# Patient Record
Sex: Male | Born: 1965 | Race: White | Hispanic: No | Marital: Single | Smoking: Current every day smoker
Health system: Southern US, Community
[De-identification: ages and names within clinical notes are randomized; demographics above are authoritative.]

## PROBLEM LIST (undated history)

## (undated) DIAGNOSIS — I10 Essential (primary) hypertension: Secondary | ICD-10-CM

## (undated) HISTORY — PX: PLEURAL SCARIFICATION: SHX748

## (undated) HISTORY — PX: HAND SURGERY: SHX662

## (undated) HISTORY — PX: LEG SURGERY: SHX1003

---

## 2008-02-13 ENCOUNTER — Ambulatory Visit: Payer: Self-pay | Admitting: Cardiology

## 2011-01-01 ENCOUNTER — Emergency Department (HOSPITAL_COMMUNITY)
Admission: EM | Admit: 2011-01-01 | Discharge: 2011-01-01 | Disposition: A | Discharge status: Home / Self Care | Payer: Self-pay | Attending: Emergency Medicine | Admitting: Emergency Medicine

## 2011-01-01 ENCOUNTER — Emergency Department (HOSPITAL_COMMUNITY): Payer: Self-pay

## 2011-01-01 DIAGNOSIS — M7020 Olecranon bursitis, unspecified elbow: Secondary | ICD-10-CM | POA: Insufficient documentation

## 2011-01-01 DIAGNOSIS — M79609 Pain in unspecified limb: Secondary | ICD-10-CM | POA: Insufficient documentation

## 2011-01-01 DIAGNOSIS — M7989 Other specified soft tissue disorders: Secondary | ICD-10-CM | POA: Insufficient documentation

## 2011-01-03 LAB — WOUND CULTURE: Culture: NO GROWTH

## 2011-09-23 ENCOUNTER — Encounter: Payer: Self-pay | Admitting: Emergency Medicine

## 2011-09-23 ENCOUNTER — Emergency Department (HOSPITAL_COMMUNITY)
Admission: EM | Admit: 2011-09-23 | Discharge: 2011-09-23 | Disposition: A | Discharge status: Home / Self Care | Payer: Self-pay | Attending: Emergency Medicine | Admitting: Emergency Medicine

## 2011-09-23 ENCOUNTER — Emergency Department (HOSPITAL_COMMUNITY): Payer: Self-pay

## 2011-09-23 DIAGNOSIS — S61239A Puncture wound without foreign body of unspecified finger without damage to nail, initial encounter: Secondary | ICD-10-CM

## 2011-09-23 DIAGNOSIS — F17200 Nicotine dependence, unspecified, uncomplicated: Secondary | ICD-10-CM | POA: Insufficient documentation

## 2011-09-23 DIAGNOSIS — S61209A Unspecified open wound of unspecified finger without damage to nail, initial encounter: Secondary | ICD-10-CM | POA: Insufficient documentation

## 2011-09-23 DIAGNOSIS — Y92009 Unspecified place in unspecified non-institutional (private) residence as the place of occurrence of the external cause: Secondary | ICD-10-CM | POA: Insufficient documentation

## 2011-09-23 DIAGNOSIS — W268XXA Contact with other sharp object(s), not elsewhere classified, initial encounter: Secondary | ICD-10-CM | POA: Insufficient documentation

## 2011-09-23 LAB — CBC
HCT: 44.2 % (ref 39.0–52.0)
MCHC: 34.2 g/dL (ref 30.0–36.0)
Platelets: 273 10*3/uL (ref 150–400)
RDW: 12.4 % (ref 11.5–15.5)
WBC: 6.3 10*3/uL (ref 4.0–10.5)

## 2011-09-23 LAB — DIFFERENTIAL
Basophils Absolute: 0 10*3/uL (ref 0.0–0.1)
Basophils Relative: 1 % (ref 0–1)
Lymphocytes Relative: 33 % (ref 12–46)
Neutro Abs: 3.5 10*3/uL (ref 1.7–7.7)
Neutrophils Relative %: 55 % (ref 43–77)

## 2011-09-23 LAB — BASIC METABOLIC PANEL
CO2: 26 mEq/L (ref 19–32)
Chloride: 105 mEq/L (ref 96–112)
GFR calc Af Amer: >90 mL/min (ref >90)
Potassium: 4.3 mEq/L (ref 3.5–5.1)
Sodium: 139 mEq/L (ref 135–145)

## 2011-09-23 MED ORDER — IBUPROFEN 800 MG PO TABS
800.0000 mg | ORAL_TABLET | Freq: Once | ORAL | Status: AC
  Administered 2011-09-23: 800 mg via ORAL
  Filled 2011-09-23: qty 1

## 2011-09-23 MED ORDER — SODIUM CHLORIDE 0.9 % IV SOLN
Freq: Once | INTRAVENOUS | Status: AC
  Administered 2011-09-23: 10:00:00 via INTRAVENOUS

## 2011-09-23 MED ORDER — OXYCODONE-ACETAMINOPHEN 5-325 MG PO TABS
1.0000 | ORAL_TABLET | Freq: Once | ORAL | Status: AC
  Administered 2011-09-23: 1 via ORAL
  Filled 2011-09-23: qty 1

## 2011-09-23 MED ORDER — TETANUS-DIPHTH-ACELL PERTUSSIS 5-2.5-18.5 LF-MCG/0.5 IM SUSP
0.5000 mL | Freq: Once | INTRAMUSCULAR | Status: AC
  Administered 2011-09-23: 0.5 mL via INTRAMUSCULAR
  Filled 2011-09-23: qty 0.5

## 2011-09-23 MED ORDER — OXYCODONE-ACETAMINOPHEN 5-325 MG PO TABS: ORAL_TABLET | ORAL | Status: DC

## 2011-09-23 MED ORDER — CEPHALEXIN 500 MG PO CAPS: 500.0000 mg | ORAL_CAPSULE | Freq: Four times a day (QID) | ORAL | Status: AC

## 2011-09-23 MED ORDER — CEFAZOLIN SODIUM 1-5 GM-% IV SOLN
1.0000 g | Freq: Once | INTRAVENOUS | Status: AC
  Administered 2011-09-23: 1 g via INTRAVENOUS
  Filled 2011-09-23: qty 50

## 2011-09-23 NOTE — ED Notes (Signed)
Pt alert and oriented x 4 and respirations even and unlabored.  Discharge instructions and prescriptions x 2 reviewed with pt and pt verbalized understanding.  Pt ambulated with steady gait to POV.  No acute distress at this time.

## 2011-09-23 NOTE — ED Notes (Signed)
Pt states he was building a shelf yesterday morning using a nail gun and a nail went through his right middle finger and came out the other side.  Pt states he is unable to move his right hand and fingers.

## 2011-09-23 NOTE — ED Provider Notes (Signed)
History     CSN: [id number] Arrival date & time: 09/23/2011  8:54 AM   First MD Initiated Contact with Patient 09/23/11 [phone]      Chief Complaint  Patient presents with  . Hand Injury    (Consider location/radiation/quality/duration/timing/severity/associated sxs/prior treatment) Patient is a 45 y.o. male presenting with hand injury. The history is provided by the patient. No language interpreter was used.  Hand Injury  The incident occurred yesterday. The incident occurred at home. Injury mechanism: brother-in-law was helping him build bookshelves and fired a  16d nail thru R 3rd finger. The pain is present in the right fingers. The quality of the pain is described as throbbing. The pain is at a severity of 4/10. The pain has been constant since the incident. Pertinent negatives include no fever. It is unknown if a foreign body is present. The symptoms are aggravated by movement and palpation. He has tried nothing for the symptoms.    History reviewed. No pertinent past medical history.  Past Surgical History  Procedure Date  . Hand surgery   . Leg surgery     No family history on file.  History  Substance Use Topics  . Smoking status: Current Everyday Smoker -- 1.5 packs/day  . Smokeless tobacco: Not on file  . Alcohol Use: Yes      Review of Systems  Constitutional: Negative for fever and chills.  Skin: Positive for wound.  All other systems reviewed and are negative.    Allergies  Review of patient's allergies indicates no known allergies.  Home Medications  No current outpatient prescriptions on file.  BP 148/99  Pulse 86  Temp(Src) 97.9 F (36.6 C) (Oral)  Resp 20  SpO2 100%  Physical Exam  Nursing note and vitals reviewed. Constitutional: He is oriented to person, place, and time. He appears well-developed and well-nourished.  HENT:  Head: Normocephalic and atraumatic.  Eyes: EOM are normal.  Neck: Normal range of motion.  Cardiovascular:  Normal rate, regular rhythm, normal heart sounds and intact distal pulses.   Pulmonary/Chest: Effort normal and breath sounds normal. No respiratory distress.  Abdominal: Soft. He exhibits no distension. There is no tenderness.  Musculoskeletal: He exhibits tenderness.       Right hand: He exhibits decreased range of motion, tenderness, bony tenderness, laceration and swelling. He exhibits normal capillary refill and no deformity. normal sensation noted.       Hands: Neurological: He is alert and oriented to person, place, and time.  Skin: Skin is warm and dry.  Psychiatric: He has a normal mood and affect. Judgment normal.    ED Course  Procedures (including critical care time)   Labs Reviewed  CBC  DIFFERENTIAL  BASIC METABOLIC PANEL   No results found.   No diagnosis found.    MDM  Talked to pt and family about x-rays.  No bony injury.  Has rec'd 1 gram of ancef IV.        Worthy Rancher, PA 09/23/11 [phone]

## 2011-09-24 NOTE — ED Provider Notes (Signed)
History     CSN: [id number] Arrival date & time: 09/23/2011  8:54 AM   First MD Initiated Contact with Patient 09/23/11 [phone]      Chief Complaint  Patient presents with  . Hand Injury    (Consider location/radiation/quality/duration/timing/severity/associated sxs/prior treatment) HPI  History reviewed. No pertinent past medical history.  Past Surgical History  Procedure Date  . Hand surgery   . Leg surgery     No family history on file.  History  Substance Use Topics  . Smoking status: Current Everyday Smoker -- 1.5 packs/day  . Smokeless tobacco: Not on file  . Alcohol Use: Yes      Review of Systems  Allergies  Review of patient's allergies indicates no known allergies.  Home Medications   Current Outpatient Rx  Name Route Sig Dispense Refill  . ASPIRIN 325 MG PO TABS Oral Take 650 mg by mouth every 6 (six) hours as needed. pain     . CEPHALEXIN 500 MG PO CAPS Oral Take 1 capsule (500 mg total) by mouth 4 (four) times daily. 28 capsule 0  . OXYCODONE-ACETAMINOPHEN 5-325 MG PO TABS  One tab po q  4-6 hrs prn pain 20 tablet 0    BP 140/86  Pulse 70  Temp(Src) 97.9 F (36.6 C) (Oral)  Resp 20  SpO2 98%  Physical Exam  ED Course  Procedures (including critical care time)   Labs Reviewed  CBC  DIFFERENTIAL  BASIC METABOLIC PANEL   Dg Finger Middle Right  09/23/2011  *RADIOLOGY REPORT*  Clinical Data: Pneumonic nail gun injury yesterday  RIGHT MIDDLE FINGER 2+V  Comparison: None  Findings: There is no evidence of fracture or dislocation.  There is no evidence of arthropathy or other focal bone abnormality.  Mild soft tissue swelling noted.  IMPRESSION:  No acute osseous injury noted.  Mild soft tissue swelling.  Original Report Authenticated By: Rosealee Albee, M.D.     1. Puncture wound of finger, right       MDM  Medical screening examination/treatment/procedure(s) were conducted as a shared visit with non-physician practitioner(s) and  myself.  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 On exam the patient has pain, though no overt signs of infection. X-ray does not indicate penetration of the appropriate joint, nor fracture. The patient was discharged with antibiotics, orthopedics followup       Gerhard Munch, MD 09/24/11 [phone]

## 2011-09-24 NOTE — ED Provider Notes (Signed)
Medical screening examination/treatment/procedure(s) were conducted as a shared visit with non-physician practitioner(s) and myself.  I personally evaluated the patient during the encounter  Gerhard Munch, MD Physician Signed  09/24/2011 7:44 AM  Delete Bookmark   History      CSN: [id number] Arrival date & time: 09/23/2011  8:54 AM    First MD Initiated Contact with Patient 09/23/11 0857         Chief Complaint   Patient presents with   .  Hand Injury      (Consider location/radiation/quality/duration/timing/severity/associated sxs/prior treatment) HPI   History reviewed. No pertinent past medical history.    Past Surgical History   Procedure  Date   .  Hand surgery     .  Leg surgery        No family history on file.    History   Substance Use Topics   .  Smoking status:  Current Everyday Smoker -- 1.5 packs/day   .  Smokeless tobacco:  Not on file   .  Alcohol Use:  Yes        Review of Systems    Allergies    Review of patient's allergies indicates no known allergies.    Home Medications       Current Outpatient Rx   Name  Route  Sig  Dispense  Refill   .  ASPIRIN 325 MG PO TABS  Oral  Take 650 mg by mouth every 6 (six) hours as needed. pain        .  CEPHALEXIN 500 MG PO CAPS  Oral  Take 1 capsule (500 mg total) by mouth 4 (four) times daily.  28 capsule  0   .  OXYCODONE-ACETAMINOPHEN 5-325 MG PO TABS    One tab po q  4-6 hrs prn pain  20 tablet  0      BP 140/86  Pulse 70  Temp(Src) 97.9 F (36.6 C) (Oral)  Resp 20  SpO2 98%   Physical Exam    ED Course    Procedures (including critical care time)   Labs Reviewed   CBC   DIFFERENTIAL   BASIC METABOLIC PANEL    Dg Finger Middle Right   09/23/2011  *RADIOLOGY REPORT*  Clinical Data: Pneumonic nail gun injury yesterday  RIGHT MIDDLE FINGER 2+V  Comparison: None  Findings: There is no evidence of fracture or dislocation.  There is no evidence of arthropathy or other focal bone  abnormality.  Mild soft tissue swelling noted.  IMPRESSION:  No acute osseous injury noted.  Mild soft tissue swelling.  Original Report Authenticated By: Rosealee Albee, M.D.        1.  Puncture wound of finger, right            MDM    Medical screening examination/treatment/procedure(s) were conducted as a shared visit with non-physician practitioner(s) and myself.  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 On exam the patient has pain, though no overt signs of infection. X-ray does not indicate penetration of the appropriate joint, nor fracture. The patient was discharged with antibiotics, orthopedics followup     (there is a separate, partial chart with the above statement as the sole entry.  Please disregard that chart as it was a system error, and cannot be deleted.)  Gerhard Munch, MD 09/24/11 [phone]

## 2011-12-29 ENCOUNTER — Encounter (HOSPITAL_COMMUNITY): Payer: Self-pay | Admitting: *Deleted

## 2011-12-29 ENCOUNTER — Emergency Department (HOSPITAL_COMMUNITY)
Admission: EM | Admit: 2011-12-29 | Discharge: 2011-12-30 | Disposition: A | Discharge status: Home / Self Care | Payer: Self-pay | Attending: Emergency Medicine | Admitting: Emergency Medicine

## 2011-12-29 DIAGNOSIS — K029 Dental caries, unspecified: Secondary | ICD-10-CM | POA: Insufficient documentation

## 2011-12-29 DIAGNOSIS — F17200 Nicotine dependence, unspecified, uncomplicated: Secondary | ICD-10-CM | POA: Insufficient documentation

## 2011-12-29 DIAGNOSIS — Z7982 Long term (current) use of aspirin: Secondary | ICD-10-CM | POA: Insufficient documentation

## 2011-12-29 DIAGNOSIS — K089 Disorder of teeth and supporting structures, unspecified: Secondary | ICD-10-CM | POA: Insufficient documentation

## 2011-12-29 MED ORDER — PENICILLIN V POTASSIUM 500 MG PO TABS: 500.0000 mg | ORAL_TABLET | Freq: Four times a day (QID) | ORAL | Status: AC

## 2011-12-29 MED ORDER — OXYCODONE-ACETAMINOPHEN 5-325 MG PO TABS
ORAL_TABLET | ORAL | Status: AC
  Filled 2011-12-29: qty 2

## 2011-12-29 MED ORDER — OXYCODONE-ACETAMINOPHEN 5-325 MG PO TABS
2.0000 | ORAL_TABLET | Freq: Once | ORAL | Status: AC
  Administered 2011-12-29: 2 via ORAL

## 2011-12-29 NOTE — ED Notes (Signed)
Pt states has a tooth on the bottom right side of jaw rotten. Pt has consulted dentist about tooth.

## 2011-12-29 NOTE — ED Provider Notes (Signed)
History     CSN: [id number]  Arrival date & time 12/29/11  2329   First MD Initiated Contact with Patient 12/29/11 2342      Chief Complaint  Patient presents with  . Dental Pain     Patient is a 46 y.o. male presenting with tooth pain. The history is provided by the patient.  Dental PainThe primary symptoms include mouth pain. Primary symptoms do not include fever. The symptoms began more than 1 month ago. The symptoms are worsening. The symptoms are chronic. The symptoms occur constantly.  Associated symptoms comments: Denies fever/vomiting .     History reviewed. No pertinent past medical history.  Past Surgical History  Procedure Date  . Hand surgery   . Leg surgery     No family history on file.  History  Substance Use Topics  . Smoking status: Current Everyday Smoker -- 1.5 packs/day  . Smokeless tobacco: Not on file  . Alcohol Use: Yes      Review of Systems  Constitutional: Negative for fever.  Gastrointestinal: Negative for vomiting.    Allergies  Review of patient's allergies indicates no known allergies.  Home Medications   Current Outpatient Rx  Name Route Sig Dispense Refill  . ASPIRIN 325 MG PO TABS Oral Take 650 mg by mouth every 6 (six) hours as needed. pain     . IBUPROFEN 200 MG PO TABS Oral Take 400 mg by mouth every 6 (six) hours as needed.    . OXYCODONE-ACETAMINOPHEN 5-325 MG PO TABS  One tab po q  4-6 hrs prn pain 20 tablet 0  . PENICILLIN V POTASSIUM 500 MG PO TABS Oral Take 1 tablet (500 mg total) by mouth 4 (four) times daily. 40 tablet 0    BP 139/85  Pulse 92  Temp(Src) 97.9 F (36.6 C) (Oral)  Resp 20  Ht 5\' 8"  (1.727 m)  Wt 145 lb (65.772 kg)  BMI 22.05 kg/m2  SpO2 98%  Physical Exam CONSTITUTIONAL: Well developed/well nourished HEAD AND FACE: Normocephalic/atraumatic EYES: EOMI/PERRL ENMT: Mucous membranes moist, no trismus.  Widespread decay, tender to palpation of right premolar but no focal abscess noted NECK:  supple no meningeal signs CV: S1/S2 noted, no murmurs/rubs/gallops noted LUNGS: Lungs are clear to auscultation bilaterally, no apparent distress ABDOMEN: soft, nontender, no rebound or guarding NEURO: Pt is awake/alert, moves all extremitiesx4 EXTREMITIES: pulses normal, full ROM SKIN: warm, color normal PSYCH: no abnormalities of mood noted  ED Course  Procedures     1. Dental caries       MDM  Nursing notes reviewed and considered in documentation         Joya Gaskins, MD 12/29/11 2354

## 2011-12-30 NOTE — ED Notes (Signed)
Pt alert & oriented x4, stable gait. Pt given discharge instructions, paperwork & prescription(s), pt verbalized understanding. Pt left department w/ no further questions.  

## 2013-04-17 ENCOUNTER — Other Ambulatory Visit (HOSPITAL_COMMUNITY): Payer: Self-pay | Admitting: *Deleted

## 2013-04-17 ENCOUNTER — Ambulatory Visit (HOSPITAL_COMMUNITY)
Admission: RE | Admit: 2013-04-17 | Discharge: 2013-04-17 | Disposition: A | Discharge status: Home / Self Care | Payer: Self-pay | Source: Ambulatory Visit | Attending: *Deleted | Admitting: *Deleted

## 2013-04-17 DIAGNOSIS — M2550 Pain in unspecified joint: Secondary | ICD-10-CM

## 2013-04-17 DIAGNOSIS — M25569 Pain in unspecified knee: Secondary | ICD-10-CM | POA: Insufficient documentation

## 2013-08-12 ENCOUNTER — Emergency Department (HOSPITAL_COMMUNITY): Payer: Self-pay

## 2013-08-12 ENCOUNTER — Emergency Department (HOSPITAL_COMMUNITY)
Admission: EM | Admit: 2013-08-12 | Discharge: 2013-08-12 | Disposition: A | Discharge status: Home / Self Care | Payer: Self-pay | Attending: Emergency Medicine | Admitting: Emergency Medicine

## 2013-08-12 ENCOUNTER — Encounter (HOSPITAL_COMMUNITY): Payer: Self-pay | Admitting: *Deleted

## 2013-08-12 DIAGNOSIS — R112 Nausea with vomiting, unspecified: Secondary | ICD-10-CM | POA: Insufficient documentation

## 2013-08-12 DIAGNOSIS — J40 Bronchitis, not specified as acute or chronic: Secondary | ICD-10-CM | POA: Insufficient documentation

## 2013-08-12 DIAGNOSIS — R197 Diarrhea, unspecified: Secondary | ICD-10-CM | POA: Insufficient documentation

## 2013-08-12 DIAGNOSIS — R509 Fever, unspecified: Secondary | ICD-10-CM | POA: Insufficient documentation

## 2013-08-12 DIAGNOSIS — H9209 Otalgia, unspecified ear: Secondary | ICD-10-CM | POA: Insufficient documentation

## 2013-08-12 DIAGNOSIS — F17200 Nicotine dependence, unspecified, uncomplicated: Secondary | ICD-10-CM | POA: Insufficient documentation

## 2013-08-12 DIAGNOSIS — R1013 Epigastric pain: Secondary | ICD-10-CM | POA: Insufficient documentation

## 2013-08-12 LAB — COMPREHENSIVE METABOLIC PANEL
ALT: 17 U/L (ref 0–53)
AST: 24 U/L (ref 0–37)
CO2: 27 mEq/L (ref 19–32)
Calcium: 9.9 mg/dL (ref 8.4–10.5)
Chloride: 100 mEq/L (ref 96–112)
GFR calc Af Amer: >90 mL/min (ref >90)
GFR calc non Af Amer: >90 mL/min (ref >90)
Glucose, Bld: 96 mg/dL (ref 70–99)
Sodium: 139 mEq/L (ref 135–145)
Total Bilirubin: 0.3 mg/dL (ref 0.3–1.2)

## 2013-08-12 LAB — CBC WITH DIFFERENTIAL/PLATELET
Basophils Absolute: 0.1 10*3/uL (ref 0.0–0.1)
Eosinophils Relative: 1 % (ref 0–5)
HCT: 49.7 % (ref 39.0–52.0)
Lymphocytes Relative: 25 % (ref 12–46)
Lymphs Abs: 1.9 10*3/uL (ref 0.7–4.0)
MCV: 98.8 fL (ref 78.0–100.0)
Monocytes Absolute: 0.8 10*3/uL (ref 0.1–1.0)
Neutro Abs: 4.7 10*3/uL (ref 1.7–7.7)
Platelets: 264 10*3/uL (ref 150–400)
RBC: 5.03 MIL/uL (ref 4.22–5.81)
RDW: 12.3 % (ref 11.5–15.5)
WBC: 7.7 10*3/uL (ref 4.0–10.5)

## 2013-08-12 MED ORDER — ONDANSETRON 8 MG PO TBDP: 8.0000 mg | ORAL_TABLET | Freq: Once | ORAL | Status: DC

## 2013-08-12 MED ORDER — ALBUTEROL SULFATE (5 MG/ML) 0.5% IN NEBU
5.0000 mg | INHALATION_SOLUTION | Freq: Once | RESPIRATORY_TRACT | Status: AC
  Administered 2013-08-12: 5 mg via RESPIRATORY_TRACT
  Filled 2013-08-12: qty 1

## 2013-08-12 MED ORDER — HYDROCODONE-ACETAMINOPHEN 5-325 MG PO TABS
1.0000 | ORAL_TABLET | Freq: Once | ORAL | Status: AC
  Administered 2013-08-12: 1 via ORAL
  Filled 2013-08-12: qty 1

## 2013-08-12 MED ORDER — GUAIFENESIN-CODEINE 100-10 MG/5ML PO SYRP: 5.0000 mL | ORAL_SOLUTION | Freq: Three times a day (TID) | ORAL | Status: AC | PRN

## 2013-08-12 MED ORDER — SODIUM CHLORIDE 0.9 % IV SOLN
INTRAVENOUS | Status: DC
  Administered 2013-08-12: 12:00:00 via INTRAVENOUS

## 2013-08-12 MED ORDER — ALBUTEROL SULFATE HFA 108 (90 BASE) MCG/ACT IN AERS
2.0000 | INHALATION_SPRAY | RESPIRATORY_TRACT | Status: DC | PRN
  Administered 2013-08-12: 2 via RESPIRATORY_TRACT
  Filled 2013-08-12: qty 6.7

## 2013-08-12 MED ORDER — PROMETHAZINE HCL 25 MG PO TABS: 25.0000 mg | ORAL_TABLET | Freq: Four times a day (QID) | ORAL | Status: AC | PRN

## 2013-08-12 MED ORDER — IPRATROPIUM BROMIDE 0.02 % IN SOLN
0.5000 mg | Freq: Once | RESPIRATORY_TRACT | Status: AC
  Administered 2013-08-12: 0.5 mg via RESPIRATORY_TRACT
  Filled 2013-08-12: qty 2.5

## 2013-08-12 MED ORDER — AZITHROMYCIN 250 MG PO TABS: 250.0000 mg | ORAL_TABLET | Freq: Every day | ORAL | Status: AC

## 2013-08-12 MED ORDER — ONDANSETRON HCL 4 MG/2ML IJ SOLN
4.0000 mg | Freq: Once | INTRAMUSCULAR | Status: AC
  Administered 2013-08-12: 4 mg via INTRAVENOUS
  Filled 2013-08-12: qty 2

## 2013-08-12 NOTE — Progress Notes (Signed)
ED/CM noted patient did not have health insurance and/or PCP listed in the computer.  Patient was given the [place] resource handout with information on the clinics, food pantries, and the handout for new health insurance sign-up.  Patient expressed appreciation for this. 

## 2013-08-12 NOTE — ED Notes (Signed)
Cough , congestion for 4 days, alert,

## 2013-08-12 NOTE — ED Notes (Signed)
Pt also reports NVD x2 days.

## 2013-08-12 NOTE — ED Provider Notes (Signed)
CSN: [id number]     Arrival date & time 08/12/13  1034 History   First MD Initiated Contact with Patient 08/12/13 1109     Chief Complaint  Patient presents with  . Cough   (Consider location/radiation/quality/duration/timing/severity/associated sxs/prior Treatment) Patient is a 47 y.o. male presenting with cough. The history is provided by the patient.  Cough Cough characteristics:  Productive Sputum characteristics:  Yellow Severity:  Moderate Onset quality:  Gradual Duration:  4 days Timing:  Sporadic Progression:  Worsening Chronicity:  New Smoker: yes   Relieved by:  Nothing Worsened by:  Smoking and lying down Ineffective treatments:  Decongestant Associated symptoms: chills, ear pain, fever, myalgias, sinus congestion and sore throat   Associated symptoms: no eye discharge and no rash    Devin Abbott is a 47 y.o. male who presents to the ED with cough, cold and congestion that started 4 days ago. He reports fever and chills, nausea, vomiting and diarrhea. Last vomited yesterday and has not eaten since then due to nausea. Diarrhea x 3 since yesterday. Patient smokes one pack per day.  History reviewed. No pertinent past medical history. Past Surgical History  Procedure Laterality Date  . Hand surgery    . Leg surgery    . Pleural scarification     History reviewed. No pertinent family history. History  Substance Use Topics  . Smoking status: Current Every Day Smoker -- 1.50 packs/day  . Smokeless tobacco: Not on file  . Alcohol Use: Yes    Review of Systems  Constitutional: Positive for fever and chills.  HENT: Positive for ear pain and sore throat.   Eyes: Negative for discharge and visual disturbance.  Respiratory: Positive for cough.   Gastrointestinal: Positive for nausea, vomiting and diarrhea.  Genitourinary: Negative for dysuria, urgency and frequency.  Musculoskeletal: Positive for myalgias.  Skin: Negative for rash.  Neurological: Negative for  dizziness and syncope.  Psychiatric/Behavioral: The patient is not nervous/anxious.     Allergies  Review of patient's allergies indicates no known allergies.  Home Medications   Current Outpatient Rx  Name  Route  Sig  Dispense  Refill  . GuaiFENesin (MUCINEX PO)   Oral   Take 1 tablet by mouth every 4 (four) hours as needed (cough).         Marland Kitchen ibuprofen (ADVIL,MOTRIN) 200 MG tablet   Oral   Take 800 mg by mouth every 6 (six) hours as needed.           BP 125/93  Pulse 89  Temp(Src) 98.3 F (36.8 C) (Oral)  Resp 20  Ht 5\' 8"  (1.727 m)  Wt 150 lb (68.04 kg)  BMI 22.81 kg/m2  SpO2 100% Physical Exam  Nursing note and vitals reviewed. Constitutional: He is oriented to person, place, and time. He appears well-developed and well-nourished. No distress.  HENT:  Head: Normocephalic.  Eyes: Conjunctivae and EOM are normal.  Neck: Neck supple.  Cardiovascular: Normal rate and regular rhythm.   Pulmonary/Chest: Effort normal. He has decreased breath sounds. He has wheezes. He has rhonchi.  Abdominal: Soft. Bowel sounds are normal. There is tenderness in the epigastric area.  Musculoskeletal: Normal range of motion.  Neurological: He is alert and oriented to person, place, and time. No cranial nerve deficit.  Skin: Skin is warm and dry.  Psychiatric: He has a normal mood and affect. His behavior is normal.   Results for orders placed during the hospital encounter of 08/12/13 (from the past 24 hour(s))  CBC WITH DIFFERENTIAL     Status: Abnormal   Collection Time    08/12/13 12:15 PM      Result Value Range   WBC 7.7  4.0 - 10.5 K/uL   RBC 5.03  4.22 - 5.81 MIL/uL   Hemoglobin 17.5 (*) 13.0 - 17.0 g/dL   HCT 16.1  09.6 - 04.5 %   MCV 98.8  78.0 - 100.0 fL   MCH 34.8 (*) 26.0 - 34.0 pg   MCHC 35.2  30.0 - 36.0 g/dL   RDW 40.9  81.1 - 91.4 %   Platelets 264  150 - 400 K/uL   Neutrophils Relative % 61  43 - 77 %   Neutro Abs 4.7  1.7 - 7.7 K/uL   Lymphocytes Relative  25  12 - 46 %   Lymphs Abs 1.9  0.7 - 4.0 K/uL   Monocytes Relative 11  3 - 12 %   Monocytes Absolute 0.8  0.1 - 1.0 K/uL   Eosinophils Relative 1  0 - 5 %   Eosinophils Absolute 0.1  0.0 - 0.7 K/uL   Basophils Relative 1  0 - 1 %   Basophils Absolute 0.1  0.0 - 0.1 K/uL  COMPREHENSIVE METABOLIC PANEL     Status: Abnormal   Collection Time    08/12/13 12:15 PM      Result Value Range   Sodium 139  135 - 145 mEq/L   Potassium 4.0  3.5 - 5.1 mEq/L   Chloride 100  96 - 112 mEq/L   CO2 27  19 - 32 mEq/L   Glucose, Bld 96  70 - 99 mg/dL   BUN 5 (*) 6 - 23 mg/dL   Creatinine, Ser 7.82  0.50 - 1.35 mg/dL   Calcium 9.9  8.4 - 95.6 mg/dL   Total Protein 8.3  6.0 - 8.3 g/dL   Albumin 4.0  3.5 - 5.2 g/dL   AST 24  0 - 37 U/L   ALT 17  0 - 53 U/L   Alkaline Phosphatase 86  39 - 117 U/L   Total Bilirubin 0.3  0.3 - 1.2 mg/dL   GFR calc non Af Amer >90  >90 mL/min   GFR calc Af Amer >90  >90 mL/min  LIPASE, BLOOD     Status: None   Collection Time    08/12/13 12:15 PM      Result Value Range   Lipase 27  11 - 59 U/L    Dg Chest 2 View  08/12/2013   CLINICAL DATA:  Cough and fever  EXAM: CHEST  2 VIEW  COMPARISON:  12/16/2012  FINDINGS: The cardiac shadow is within normal limits. The lungs are well aerated bilaterally. No focal infiltrate is seen. Very mild interstitial changes are noted.  IMPRESSION: No active cardiopulmonary disease.   Electronically Signed   By: Alcide Clever   On: 08/12/2013 13:00    ED Course: 14:28 after IV hydration, Zofran and albuterol/atrovent neb. Patient feeling better. Re examined and decreased wheezing, less coughing, no nausea. Will repeat Albuterol neb.  Procedures  14:50 Re examined, improved breath sounds, continues to have some wheezing. Albuterol Inhaler to go home with patient, instructions to patient given by Respiratory therapist.    MDM  47 y.o. male with cough cold and congestion x 4 days, nausea and vomiting. Will treat for bronchitis and  gastroenteritis. Patient stable for discharge without any immediate complications. I have reviewed this patient's vital signs, nurses notes,  appropriate labs and imaging.  I have discussed findings with the patient and plan of care. He voices understanding. He will return if symptoms worsen.   Medication List    TAKE these medications       azithromycin 250 MG tablet  Commonly known as:  ZITHROMAX  Take 1 tablet (250 mg total) by mouth daily. Take first 2 tablets together, then 1 every day until finished.     guaiFENesin-codeine 100-10 MG/5ML syrup  Commonly known as:  ROBITUSSIN AC  Take 5 mLs by mouth 3 (three) times daily as needed for cough.     promethazine 25 MG tablet  Commonly known as:  PHENERGAN  Take 1 tablet (25 mg total) by mouth every 6 (six) hours as needed for nausea.      ASK your doctor about these medications       ibuprofen 200 MG tablet  Commonly known as:  ADVIL,MOTRIN  Take 800 mg by mouth every 6 (six) hours as needed.     MUCINEX PO  Take 1 tablet by mouth every 4 (four) hours as needed (cough).           [facility] Orlene Och, [place] 08/17/13 [phone]

## 2013-08-12 NOTE — ED Notes (Signed)
Pt c/o cough and congestion since Friday night. Pt states cough is productive with white/yellow sputum. Pt also reports fever.

## 2013-08-21 NOTE — ED Provider Notes (Signed)
Medical screening examination/treatment/procedure(s) were performed by non-physician practitioner and as supervising physician I was immediately available for consultation/collaboration.   Raejean Swinford W. Mikiya Nebergall, MD 08/21/13 1124 

## 2014-08-08 ENCOUNTER — Emergency Department (HOSPITAL_COMMUNITY)
Admission: EM | Admit: 2014-08-08 | Discharge: 2014-08-08 | Disposition: A | Discharge status: Home / Self Care | Payer: Self-pay | Attending: Emergency Medicine | Admitting: Emergency Medicine

## 2014-08-08 ENCOUNTER — Encounter (HOSPITAL_COMMUNITY): Payer: Self-pay | Admitting: Emergency Medicine

## 2014-08-08 DIAGNOSIS — K0889 Other specified disorders of teeth and supporting structures: Secondary | ICD-10-CM

## 2014-08-08 DIAGNOSIS — Z792 Long term (current) use of antibiotics: Secondary | ICD-10-CM | POA: Insufficient documentation

## 2014-08-08 DIAGNOSIS — K089 Disorder of teeth and supporting structures, unspecified: Secondary | ICD-10-CM | POA: Insufficient documentation

## 2014-08-08 DIAGNOSIS — Z791 Long term (current) use of non-steroidal anti-inflammatories (NSAID): Secondary | ICD-10-CM | POA: Insufficient documentation

## 2014-08-08 DIAGNOSIS — F17200 Nicotine dependence, unspecified, uncomplicated: Secondary | ICD-10-CM | POA: Insufficient documentation

## 2014-08-08 DIAGNOSIS — I10 Essential (primary) hypertension: Secondary | ICD-10-CM | POA: Insufficient documentation

## 2014-08-08 HISTORY — DX: Essential (primary) hypertension: I10

## 2014-08-08 MED ORDER — ACETAMINOPHEN 500 MG PO TABS
1000.0000 mg | ORAL_TABLET | Freq: Once | ORAL | Status: AC
  Administered 2014-08-08: 1000 mg via ORAL
  Filled 2014-08-08: qty 2

## 2014-08-08 MED ORDER — TRAMADOL HCL 50 MG PO TABS: 100.0000 mg | ORAL_TABLET | Freq: Four times a day (QID) | ORAL | Status: AC | PRN

## 2014-08-08 MED ORDER — PENICILLIN V POTASSIUM 500 MG PO TABS: 500.0000 mg | ORAL_TABLET | Freq: Four times a day (QID) | ORAL | Status: AC

## 2014-08-08 MED ORDER — IBUPROFEN 800 MG PO TABS
800.0000 mg | ORAL_TABLET | Freq: Once | ORAL | Status: AC
  Administered 2014-08-08: 800 mg via ORAL
  Filled 2014-08-08: qty 1

## 2014-08-08 MED ORDER — TRAMADOL HCL 50 MG PO TABS
100.0000 mg | ORAL_TABLET | Freq: Once | ORAL | Status: AC
  Administered 2014-08-08: 100 mg via ORAL
  Filled 2014-08-08: qty 2

## 2014-08-08 NOTE — Discharge Instructions (Signed)
Try ice and heat on your face where you have pain. Take the penicillin until gone. Take the tramadol with acetaminophen 1000 mg + Ibuprofen 600 mg 4 times a day for pain. You will need to see an oral surgeon to have the decayed teeth removed.  Return to the ED if you get fever, increased swelling or redness of your face.    Dental Pain A tooth ache may be caused by cavities (tooth decay). Cavities expose the nerve of the tooth to air and hot or cold temperatures. It may come from an infection or abscess (also called a boil or furuncle) around your tooth. It is also often caused by dental caries (tooth decay). This causes the pain you are having. DIAGNOSIS  Your caregiver can diagnose this problem by exam. TREATMENT   If caused by an infection, it may be treated with medications which kill germs (antibiotics) and pain medications as prescribed by your caregiver. Take medications as directed.  Only take over-the-counter or prescription medicines for pain, discomfort, or fever as directed by your caregiver.  Whether the tooth ache today is caused by infection or dental disease, you should see your dentist as soon as possible for further care. SEEK MEDICAL CARE IF: The exam and treatment you received today has been provided on an emergency basis only. This is not a substitute for complete medical or dental care. If your problem worsens or new problems (symptoms) appear, and you are unable to meet with your dentist, call or return to this location. SEEK IMMEDIATE MEDICAL CARE IF:   You have a fever.  You develop redness and swelling of your face, jaw, or neck.  You are unable to open your mouth.  You have severe pain uncontrolled by pain medicine. MAKE SURE YOU:   Understand these instructions.  Will watch your condition.  Will get help right away if you are not doing well or get worse. Document Released: 10/30/2005 Document Revised: 01/22/2012 Document Reviewed: 06/17/2008 [facility]  Patient Information 2015 [place], [place]. This information is not intended to replace advice given to you by your health care provider. Make sure you discuss any questions you have with your health care provider.
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
## 2014-08-08 NOTE — ED Notes (Signed)
Left lower toothache x 1 wk.

## 2014-12-23 ENCOUNTER — Encounter (HOSPITAL_COMMUNITY): Payer: Self-pay | Admitting: Emergency Medicine

## 2014-12-23 ENCOUNTER — Emergency Department (HOSPITAL_COMMUNITY)
Admission: EM | Admit: 2014-12-23 | Discharge: 2014-12-23 | Disposition: A | Discharge status: Home / Self Care | Payer: Self-pay | Attending: Emergency Medicine | Admitting: Emergency Medicine

## 2014-12-23 DIAGNOSIS — I10 Essential (primary) hypertension: Secondary | ICD-10-CM | POA: Insufficient documentation

## 2014-12-23 DIAGNOSIS — K088 Other specified disorders of teeth and supporting structures: Secondary | ICD-10-CM | POA: Insufficient documentation

## 2014-12-23 DIAGNOSIS — K029 Dental caries, unspecified: Secondary | ICD-10-CM | POA: Insufficient documentation

## 2014-12-23 DIAGNOSIS — K0889 Other specified disorders of teeth and supporting structures: Secondary | ICD-10-CM

## 2014-12-23 DIAGNOSIS — Z792 Long term (current) use of antibiotics: Secondary | ICD-10-CM | POA: Insufficient documentation

## 2014-12-23 DIAGNOSIS — Z79899 Other long term (current) drug therapy: Secondary | ICD-10-CM | POA: Insufficient documentation

## 2014-12-23 DIAGNOSIS — Z720 Tobacco use: Secondary | ICD-10-CM | POA: Insufficient documentation

## 2014-12-23 MED ORDER — HYDROCODONE-ACETAMINOPHEN 5-325 MG PO TABS
ORAL_TABLET | ORAL | Status: AC
Start: 2014-12-23 — End: ?

## 2014-12-23 MED ORDER — CLINDAMYCIN HCL 300 MG PO CAPS: 300.0000 mg | ORAL_CAPSULE | Freq: Four times a day (QID) | ORAL | Status: AC

## 2014-12-23 NOTE — ED Notes (Signed)
Pt reports "abscessed tooth for awhile." nad noted.

## 2014-12-23 NOTE — Discharge Instructions (Signed)
Dental Pain °Toothache is pain in or around a tooth. It may get worse with chewing or with cold or heat.  °HOME CARE °· Your dentist may use a numbing medicine during treatment. If so, you may need to avoid eating until the medicine wears off. Ask your dentist about this. °· Only take medicine as told by your dentist or doctor. °· Avoid chewing food near the painful tooth until after all treatment is done. Ask your dentist about this. °GET HELP RIGHT AWAY IF:  °· The problem gets worse or new problems appear. °· You have a fever. °· There is redness and puffiness (swelling) of the face, jaw, or neck. °· You cannot open your mouth. °· There is pain in the jaw. °· There is very bad pain that is not helped by medicine. °MAKE SURE YOU:  °· Understand these instructions. °· Will watch your condition. °· Will get help right away if you are not doing well or get worse. °Document Released: 04/17/2008 Document Revised: 01/22/2012 Document Reviewed: 04/17/2008 °ExitCare® Patient Information ©2015 [employer]. This information is not intended to replace advice given to you by your health care provider. Make sure you discuss any questions you have with your health care provider. ° ° ° °Emergency Department Resource Guide °1) Find a Doctor and Pay Out of Pocket °Although you won't have to find out who is covered by your insurance plan, it is a good idea to ask around and get recommendations. You will then need to call the office and see if the doctor you have chosen will accept you as a new patient and what types of options they offer for patients who are self-pay. Some doctors offer discounts or will set up payment plans for their patients who do not have insurance, but you will need to ask so you aren't surprised when you get to your appointment. ° °2) Contact Your Local Health Department °Not all health departments have doctors that can see patients for sick visits, but many do, so it is worth a call to see if yours does.  If you don't know where your local health department is, you can check in your phone book. The CDC also has a tool to help you locate your state's health department, and many state websites also have listings of all of their local health departments. ° °3) Find a Walk-in Clinic °If your illness is not likely to be very severe or complicated, you may want to try a walk in clinic. These are popping up all over the country in pharmacies, drugstores, and shopping centers. They're usually staffed by nurse practitioners or physician assistants that have been trained to treat common illnesses and complaints. They're usually fairly quick and inexpensive. However, if you have serious medical issues or chronic medical problems, these are probably not your best option. ° °No Primary Care Doctor: °- Call [facility] at  832-8000 - they can help you locate a primary care doctor that  accepts your insurance, provides certain services, etc. °- Physician Referral Service- 1-800-533-3463 ° °Chronic Pain Problems: °Organization         Address  Phone   Notes  °[facility]  (336) 297-2271 Patients need to be referred by their primary care doctor.  ° °Medication Assistance: °Organization         Address  Phone   Notes  °[place] [facility] [address] °[place], [place] [address] (336) 641-8030 --Must be a resident   of [place] °-- Must have NO insurance coverage whatsoever (no Medicaid/ Medicare, etc.) °-- The pt. MUST have a primary care doctor that directs their care regularly and follows them in the community °  °[facility]  (866) 331-1348   °[employer]  (888) 892-1162   ° °Agencies that provide inexpensive medical care: °Organization         Address  Phone   Notes  °[facility]  (336) 832-8035   °[facility]    (336) 832-7272   °[facility] [address] °[place], [place] [address] (336) 832-4777   °[facility] [place] [address], °[place] (336) 271-4999   °[facility]    (336) 373-0678   °[facility]    (336) 272-1050   °[facility] ° [address], [place] Phone:  (336) 832-4444, Fax:  (336) 832-4440 Hours of Operation:  9 am - 6 pm, M-F.  Also accepts Medicaid/Medicare and self-pay.  °[facility] ° [address], [place] Phone: (336) 832-3150, Fax: (336) 832-3151. Hours of Operation:  8:30 am - 5:30 pm, M-F.  Also accepts Medicaid and self-pay.  °[facility] [place] [address], [place] Phone: (336) 878-6027   °[facility] [address], [place], [place] (336)723-1848, Ext. 123 Mondays & Thursdays: 7-9 AM.  First 15 patients are seen on a first come, first serve basis. °  ° °Medicaid-accepting [place] Providers: ° °Organization         Address  Phone   Notes  °[facility] [address], [place] (336) 641-2100 Also accepts self-pay patients.  °[facility] [address], [place] ° (336) 856-9996   °[facility] [address], [place] (336) 288-8857   °[facility] [address] [place] [address], [place] (336) 299-7000   °Veita B[facility] [address], [place]  ° (336) 373-1557 Only accepts [place] Access Medicaid patients after they have their name applied to their card.  ° °Self-Pay (no insurance) in [place]: ° °Organization         Address  Phone   Notes  °Sickle Cell Patients, [facility] [address], [place] (336) 832-1970   °[facility] [address], [place] (336) 832-4400   °[facility] [place] [facility] ° [address], [place] (336) 992-4800   °[facility]/Dr. Osei-Bonsu ° [address] [place] [address], [place] or [address], [place] (336) 841-8500  Phone number for both [place] and [place] locations is the same.  °[facility] [address], [place] (336) 299-0000   °[facility] [address] [place] [address], [place] or [address] (336) 852-7530 °(336) 878-2260   °[facility] [address], [place] (336) 350-1642, phone; (336) 294-5005, fax Sees patients 1st and 3rd Saturday of every month.  Must not qualify for public or private insurance (i.e. Medicaid, Medicare, [place] Health Choice, Veterans' Benefits) • Household income should be no more than 200% of the poverty level •The clinic cannot treat you if you are pregnant or think you are pregnant • Sexually transmitted diseases are not treated at the clinic.  ° ° °Dental Care: °Organization         Address    Phone  Notes  °[facility] [facility] [address], [place] (336) 641-6152 Accepts children up to age 21 who are enrolled in Medicaid or [place] Health Choice; pregnant women with a Medicaid card; and children who have applied for Medicaid or [place] Health Choice, but were declined, whose parents can pay a reduced fee at time of service.  °[facility] [place]  [address], [place] (336) 641-7733 Accepts children up to age 21 who are enrolled in Medicaid or [place] Health Choice; pregnant women with a Medicaid card; and children who have applied for Medicaid or [place] Health Choice, but were declined, whose parents can pay a reduced fee at time of service.  °[facility] ° [address], [place] (336) 641-4533 Patients are seen by appointment only. Walk-ins are not accepted. [facility] will see patients 18 years of age and older. °Monday - Tuesday (8am-5pm) °Most Wednesdays (8:30-5pm) °$30 per visit, cash only  °[facility] ° [address], [place] (336) 641-4533 Patients are seen by appointment  only. Walk-ins are not accepted. [facility] will see patients 18 years of age and older. °One Wednesday Evening (Monthly: Volunteer Based).  $30 per visit, cash only  °[facility]  (919) 537-3737 for adults; Children under age 4, call Graduate Pediatric Dentistry at (919) [phone]. Children aged 4-14, please call ([phone]) [phone] to request a pediatric application. ° Dental services are provided in all areas of dental care including fillings, crowns and bridges, complete and partial dentures, implants, gum treatment, root canals, and extractions. Preventive care is also provided. Treatment is provided to both adults and children. °Patients are selected via a lottery and there is often a waiting list. °  °[facility] [address], °[place] ° (336) 763-8833 [email or web address] °  °[facility] [address], [place], [place] (336)723-1848, Ext. 123 Second and Fourth Thursday of each month, opens at 6:30 AM; Clinic ends at 9 AM.  Patients are seen on a first-come first-served basis, and a limited number are seen during each clinic.  ° °[facility] ° [address], [place], [place] (336) 723-7904   Eligibility Requirements °You must have lived in [place], [place], or [place] counties for at least the last three months. °  You cannot be eligible for state or federal sponsored healthcare insurance, including Veterans Administration, Medicaid, or Medicare. °  You generally cannot be eligible for healthcare insurance through your employer.  °  How to apply: °Eligibility screenings are held every Tuesday and Wednesday afternoon from 1:00 pm until 4:00 pm. You do not need an appointment for the interview!  °[facility] [address], [place], [place] 336-631-2330   °[facility]  336-342-8273   °[facility]  336-703-3100   °[facility]  336-570-6415   ° °Behavioral Health  Resources in the Community: °Intensive Outpatient Programs °Organization         Address  Phone  Notes  °[facility] [address], [place], [place] 336-878-6098   °[facility] [address], [place], [place] 336-832-9800   °[facility] [address], [place], [place] ° 336-882-2125   °[facility] [address],  °, [place] 1-800-853-5163 or 336-641-4981   °Substance Abuse Resources °Organization           Address  Phone  Notes  °[facility]  336-882-2125   °[facility]  336-784-9470   °[facility]  336-285-9073   °[facility]  336-845-3988   °Residential & Outpatient Substance Abuse Program  1-800-659-3381   °Psychological Services °Organization         Address  Phone  Notes  °[facility]  336- 832-9600   °[facility]  336- 378-7881   °[facility] [address], [place] 1-800-853-5163 or 336-641-4981   ° °Mobile Crisis Teams °Organization         Address  Phone  Notes  °[facility], Mobile Crisis Care Unit  1-877-626-1772   °Assertive °[facility] ° [address] [place], [place] 336-834-9664   °Sharon DeEsch [address] °[place] 336-554-5454   ° °Self-Help/Support Groups °Organization         Address  Phone             Notes  °[facility] - variety of support groups  336- 373-1402 Call for more information  °Narcotics Anonymous (NA), [facility] [address], °[place]  2 meetings at this location  ° °Residential Treatment Programs °Organization         Address  Phone  Notes  °[facility] [address],    °[place]  1-866-801-8205   °[facility] ° [address], [place], [place] 704-293-8524   °[facility] [address], [place] 336-845-3988 Admissions: 8am-3pm M-F  °[facility] [address]    °[place], [place] 336-841-1104   °[facility] [address], [place], [place] 336-379-7146   °[facility] [address]  °[place], [place] 336-285-9073   °[facility] Intensive Outpatient [address], [place], [place] 336-852-3033   °[facility] [address]  °[place], [place] 1-877-615-2722 or 336-784-9470   °[facility] [address] , [place] 336-227-7417 Accepts Medicaid  °[facility] [address]  °[place] 1-800-659-3381 Substance Abuse/Addiction Treatment  ° °[place] Behavioral Health Resources °Organization         Address  Phone  Notes  °[facility]  (888) 581-9988   °Julie Brannon, PhD [address] [place], [place]   (336) 349-5553 or (336) 951-0000   °[facility]   [address] °[place], [place] (336) 349-4454   °[facility] [address], [place], [place] (336) 342-8316 Insurance/Medicaid/sponsorship through [facility]  °[facility] [address]                                    [place], [place] (336) 342-8316 Therapy/tele-psych/case  °[facility] [address]  ° [place], [place] (336) 349-2233    °Dr. Arfeen  (336) 349-4544   °[facility] [place]  [employer] [place] [facility] 1) [address],  °2) [address], [place] °3)  [address], [place] (336) 349-3220 °(336) 342-7768 ° °(336) 342-8140   °[place] [facility] (336) 342-1394 or (336) 342-3537 (After Hours)    ° °  °

## 2014-12-23 NOTE — ED Provider Notes (Signed)
CSN: [id number]     Arrival date & time 12/23/14  0906 History   First MD Initiated Contact with Patient 12/23/14 [phone]     Chief Complaint  Patient presents with  . Dental Pain     (Consider location/radiation/quality/duration/timing/severity/associated sxs/prior Treatment) HPI   Devin Abbott is a 49 y.o. male who presents to the Emergency Department complaining of persistent dental pain for several weeks to a month.  He reports pain to several left lower teeth with recent swelling of his jaw.  He has not tried any medications for pain.  He states he does not have funds to see a dentist.  He denies fever, neck pain, difficulty swallowing or difficulty opening his mouth.  Past Medical History  Diagnosis Date  . Hypertension    Past Surgical History  Procedure Laterality Date  . Hand surgery    . Leg surgery    . Pleural scarification     History reviewed. No pertinent family history. History  Substance Use Topics  . Smoking status: Current Every Day Smoker -- 1.50 packs/day    Types: Cigarettes  . Smokeless tobacco: Not on file  . Alcohol Use: No    Review of Systems  Constitutional: Negative for fever and appetite change.  HENT: Positive for dental problem. Negative for congestion, facial swelling, sore throat and trouble swallowing.   Eyes: Negative for pain and visual disturbance.  Musculoskeletal: Negative for neck pain and neck stiffness.  Neurological: Negative for dizziness, facial asymmetry and headaches.  Hematological: Negative for adenopathy.  All other systems reviewed and are negative.     Allergies  Review of patient's allergies indicates no known allergies.  Home Medications   Prior to Admission medications   Medication Sig Start Date End Date Taking? Authorizing Provider  azithromycin (ZITHROMAX) 250 MG tablet Take 1 tablet (250 mg total) by mouth daily. Take first 2 tablets together, then 1 every day until finished. 08/12/13   Hope Orlene OchM Neese, NP    GuaiFENesin (MUCINEX PO) Take 1 tablet by mouth every 4 (four) hours as needed (cough).    Historical Provider, MD  guaiFENesin-codeine (ROBITUSSIN AC) 100-10 MG/5ML syrup Take 5 mLs by mouth 3 (three) times daily as needed for cough. 08/12/13   Hope Orlene OchM Neese, NP  ibuprofen (ADVIL,MOTRIN) 200 MG tablet Take 800 mg by mouth every 6 (six) hours as needed.     Historical Provider, MD  penicillin v potassium (VEETID) 500 MG tablet Take 1 tablet (500 mg total) by mouth 4 (four) times daily. 08/08/14   Ward GivensIva L Knapp, MD  promethazine (PHENERGAN) 25 MG tablet Take 1 tablet (25 mg total) by mouth every 6 (six) hours as needed for nausea. 08/12/13   Hope Orlene OchM Neese, NP  traMADol (ULTRAM) 50 MG tablet Take 2 tablets (100 mg total) by mouth every 6 (six) hours as needed. 08/08/14   Ward GivensIva L Knapp, MD   BP 172/105 mmHg  Pulse 78  Temp(Src) 98.1 F (36.7 C) (Oral)  Resp 18  Ht 5\' 8"  (1.727 m)  Wt 150 lb (68.04 kg)  BMI 22.81 kg/m2  SpO2 100% Physical Exam  Constitutional: He is oriented to person, place, and time. He appears well-developed and well-nourished. No distress.  HENT:  Head: Normocephalic and atraumatic.  Right Ear: Tympanic membrane and ear canal normal.  Left Ear: Tympanic membrane and ear canal normal.  Mouth/Throat: Uvula is midline, oropharynx is clear and moist and mucous membranes are normal. No trismus in the jaw. Dental caries  present. No dental abscesses or uvula swelling.    Tenderness and dental caries of the left lower molars.  Mild facial swelling along the left lawline, no obvious dental abscess, trismus, or sublingual abnml.  Multiple dental caries and poor dental hygiene  Neck: Normal range of motion. Neck supple.  Cardiovascular: Normal rate, regular rhythm and normal heart sounds.   No murmur heard. Pulmonary/Chest: Effort normal and breath sounds normal.  Musculoskeletal: Normal range of motion.  Lymphadenopathy:    He has no cervical adenopathy.  Neurological: He is alert and  oriented to person, place, and time. He exhibits normal muscle tone. Coordination normal.  Skin: Skin is warm and dry.  Nursing note and vitals reviewed.   ED Course  Procedures (including critical care time) Labs Review Labs Reviewed - No data to display  Imaging Review No results found.   EKG Interpretation None      MDM   Final diagnoses:  Pain, dental    Pt is well appearing.  Hx of dental caries.  No concerning sx's for infection to the floor of the mouth or deep structures of the neck.  Pt strongly advised to arrange dental f/u.  Resource list given.  rx for clindamycin and hydrocodone    Devin Zidek L. Trisha Mangle, PA-C 12/25/14 1947  Vanetta Mulders, MD 12/26/14 1040

## 2015-05-14 IMAGING — CR DG CHEST 2V
2 series · 2 of 2 positions shown · non-contrast
Comparison: 12/16/2012

CLINICAL DATA: Cough and fever

EXAM:
CHEST  2 VIEW

[view not recorded (1 of 2)]
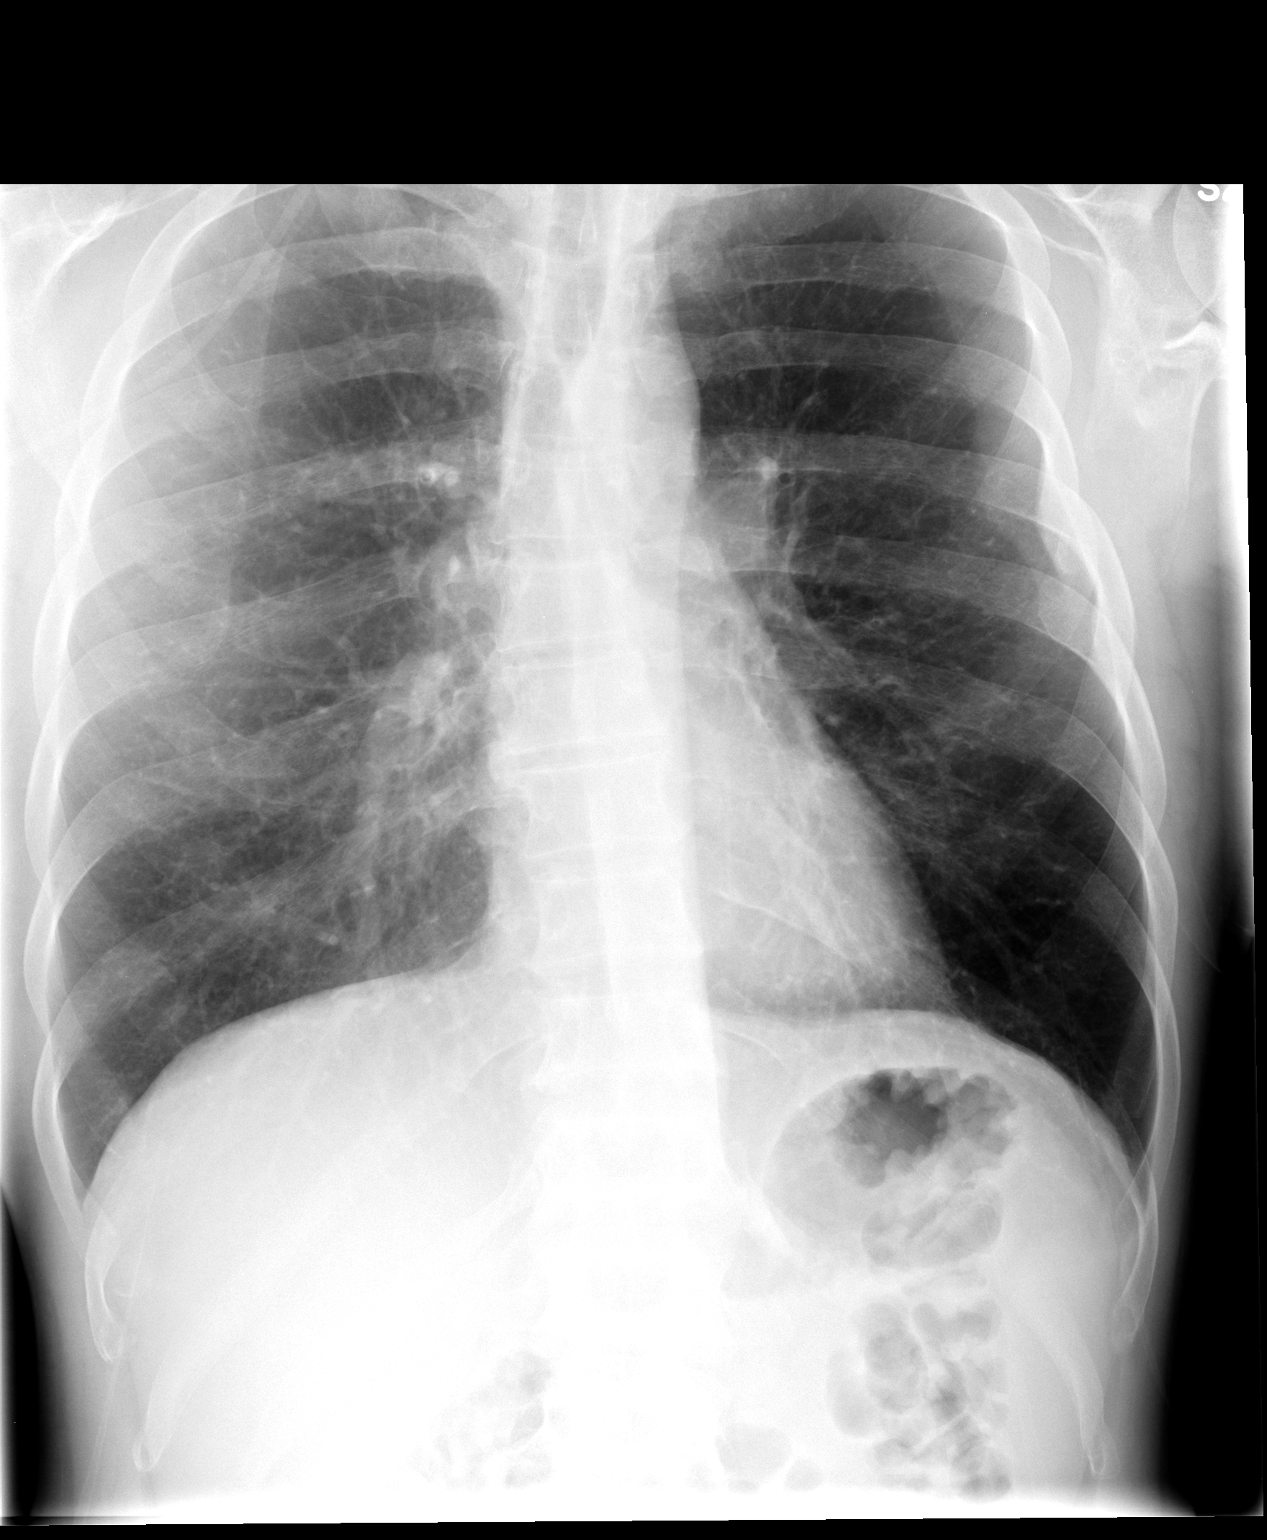

[view not recorded (2 of 2)]
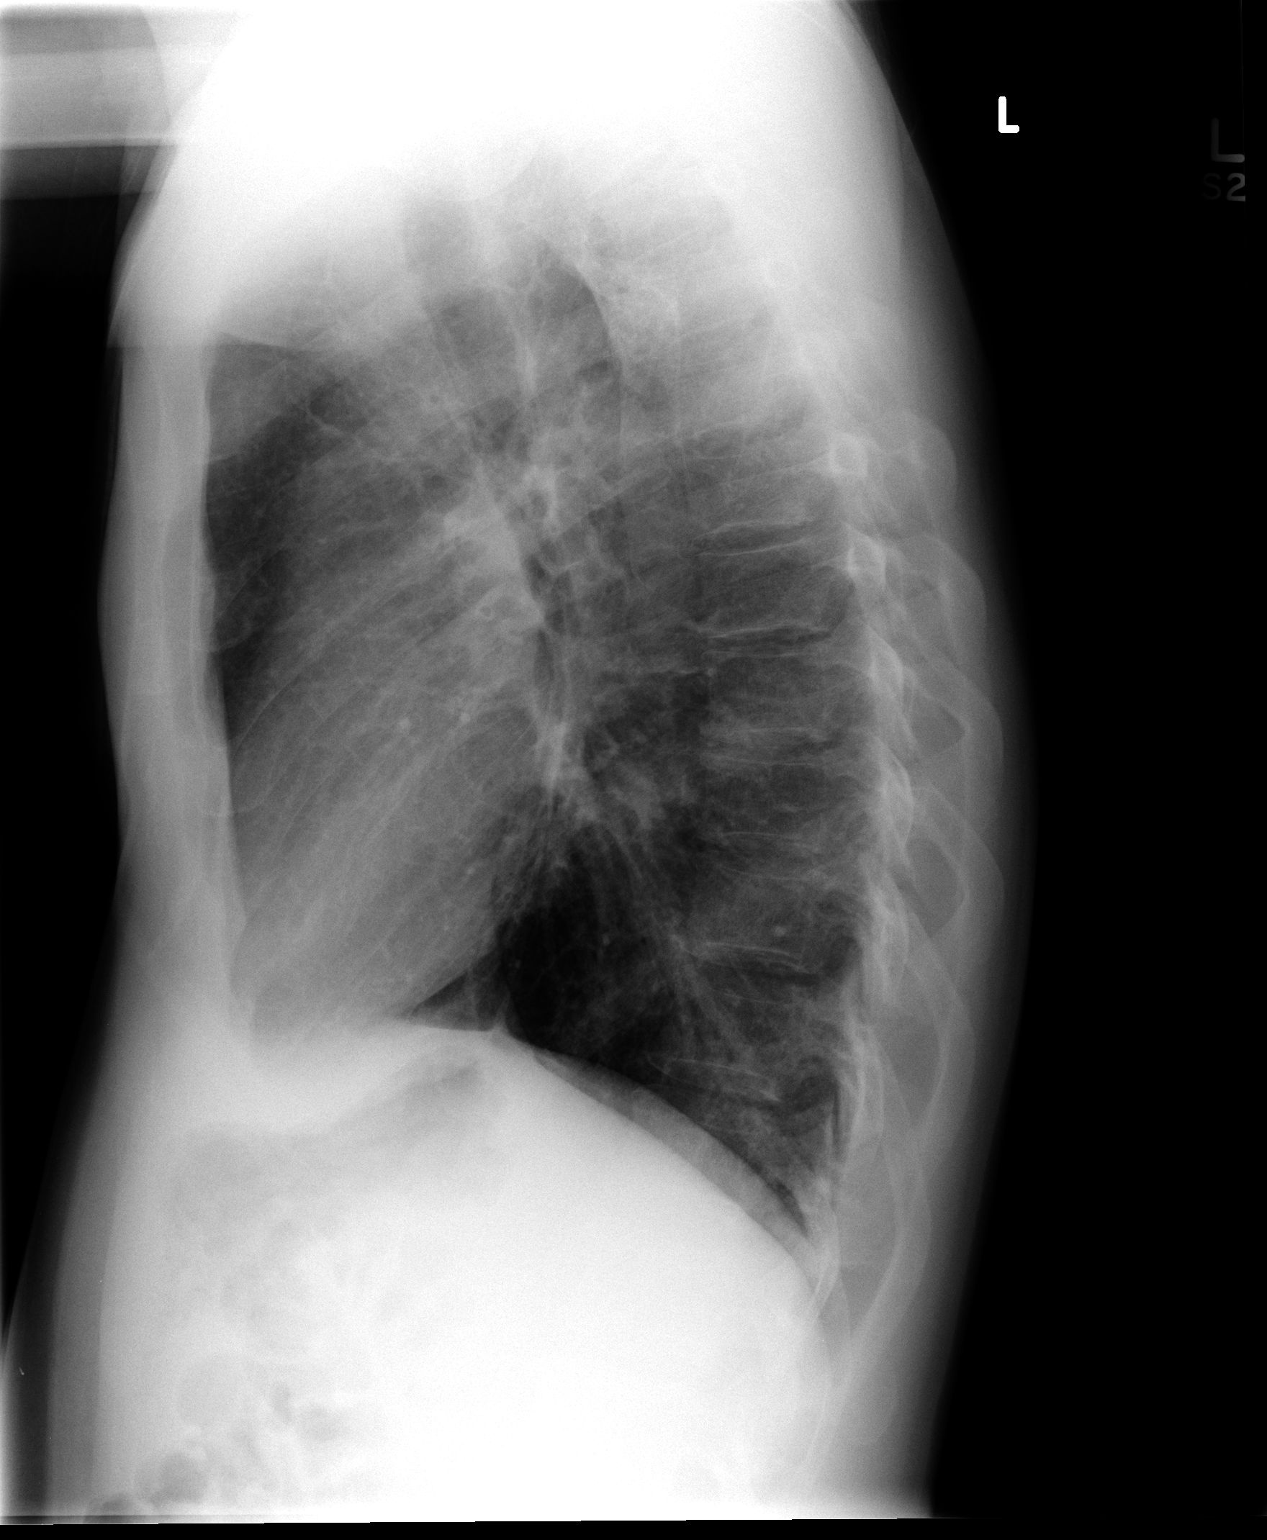

[2 of 2 positions shown; findings below may reference images not displayed]

FINDINGS: The cardiac shadow is within normal limits. The lungs are well
aerated bilaterally. No focal infiltrate is seen. Very mild
interstitial changes are noted.
IMPRESSION: No active cardiopulmonary disease.
